# Patient Record
Sex: Female | Born: 1990 | Race: Black or African American | Hispanic: No | Marital: Married
Health system: Southern US, Community
[De-identification: ages and names within clinical notes are randomized; demographics above are authoritative.]

---

## 2017-03-20 ENCOUNTER — Ambulatory Visit (HOSPITAL_COMMUNITY)
Admission: EM | Admit: 2017-03-20 | Discharge: 2017-03-20 | Disposition: A | Discharge status: Home / Self Care | Payer: Medicaid Other | Attending: Internal Medicine | Admitting: Internal Medicine

## 2017-03-20 ENCOUNTER — Encounter (HOSPITAL_COMMUNITY): Payer: Self-pay | Admitting: Emergency Medicine

## 2017-03-20 DIAGNOSIS — R51 Headache: Secondary | ICD-10-CM

## 2017-03-20 DIAGNOSIS — Z3202 Encounter for pregnancy test, result negative: Secondary | ICD-10-CM

## 2017-03-20 DIAGNOSIS — J019 Acute sinusitis, unspecified: Secondary | ICD-10-CM

## 2017-03-20 DIAGNOSIS — R519 Headache, unspecified: Secondary | ICD-10-CM

## 2017-03-20 LAB — POCT PREGNANCY, URINE: PREG TEST UR: NEGATIVE

## 2017-03-20 MED ORDER — KETOROLAC TROMETHAMINE 60 MG/2ML IM SOLN
60.0000 mg | Freq: Once | INTRAMUSCULAR | Status: AC
  Administered 2017-03-20: 60 mg via INTRAMUSCULAR

## 2017-03-20 MED ORDER — KETOROLAC TROMETHAMINE 60 MG/2ML IM SOLN
INTRAMUSCULAR | Status: AC
  Filled 2017-03-20: qty 2

## 2017-03-20 NOTE — ED Triage Notes (Addendum)
Pt complains of headache since yesterday through an interpreter that came with her.  Most of the triage was done through this interpreter, but then [employer] was used for the rest of the exam.  [place] has been in the [place] for two months and presents with her young daughter.  Interpreter Satie [phone]

## 2017-03-20 NOTE — ED Provider Notes (Signed)
CSN: [id number]     Arrival date & time 03/20/17  1419 History   None    Chief Complaint  Patient presents with  . Headache   (Consider location/radiation/quality/duration/timing/severity/associated sxs/prior Treatment) Amanda Reilly is a 26 y.o. female who presents to the [facility] urgent care with a chief complaint of headache for two days.   The history is provided by the patient. The history is limited by a language barrier. A language interpreter was used.  Headache  Pain location:  Frontal Quality:  Dull Radiates to:  Does not radiate Onset quality:  Gradual Duration:  2 days Timing:  Constant Progression:  Unchanged Chronicity:  New Similar to prior headaches: no   Context: loud noise   Context: not exposure to bright light, not caffeine, not defecating and not eating   Relieved by:  Acetaminophen Worsened by:  Sound Associated symptoms: no abdominal pain, no back pain, no congestion, no ear pain, no fever, no nausea, no neck pain, no neck stiffness, no photophobia, no sinus pressure, no sore throat, no swollen glands, no tingling, no URI, no vomiting and no weakness     History reviewed. No pertinent past medical history. History reviewed. No pertinent surgical history. History reviewed. No pertinent family history. Social History  Substance Use Topics  . Smoking status: Unknown If Ever Smoked  . Smokeless tobacco: Not on file  . Alcohol use Not on file   OB History    No data available     Review of Systems  Constitutional: Negative for chills and fever.  HENT: Negative for congestion, ear pain, sinus pressure and sore throat.   Eyes: Negative for photophobia.  Respiratory: Negative.   Cardiovascular: Negative.   Gastrointestinal: Negative for abdominal pain, nausea and vomiting.  Musculoskeletal: Negative for back pain, neck pain and neck stiffness.  Skin: Negative.   Neurological: Positive for headaches. Negative for weakness and light-headedness.     Allergies  Patient has no known allergies.  Home Medications   Prior to Admission medications   Not on File   Meds Ordered and Administered this Visit   Medications  ketorolac (TORADOL) injection 60 mg (not administered)    BP 123/85 (BP Location: Right Arm)   Pulse 84   Temp 98.6 F (37 C) (Oral)   LMP  (LMP Unknown)   SpO2 98%  No data found.   Physical Exam  Constitutional: She is oriented to person, place, and time. She appears well-developed and well-nourished. No distress.  HENT:  Head: Normocephalic and atraumatic.  Right Ear: External ear normal.  Left Ear: External ear normal.  Eyes: Conjunctivae and EOM are normal. Pupils are equal, round, and reactive to light.  Cardiovascular: Normal rate and regular rhythm.   Pulmonary/Chest: Effort normal and breath sounds normal.  Neurological: She is alert and oriented to person, place, and time. No cranial nerve deficit.  Skin: Skin is warm and dry. Capillary refill takes less than 2 seconds. No rash noted. She is not diaphoretic. No erythema.  Psychiatric: She has a normal mood and affect. Her behavior is normal.  Nursing note and vitals reviewed.   Urgent Care Course     Procedures (including critical care time)  Labs Review Labs Reviewed  POCT PREGNANCY, URINE    Imaging Review No results found.      MDM   1. Sinus headache     No abnormal findings and history, physical, index of suspicion is low for significant underlying pathology. Given injection of  Toradol in clinic, may take over-the-counter acetaminophen as needed. Recommend following up with community health and wellness if symptoms persist. Follow-up guidelines given to go to the emergency room at any time her headache worsens.    Dorena BodoKennard, Rachard Isidro, NP 03/20/17 1529

## 2017-03-20 NOTE — Discharge Instructions (Signed)
For your headache, you have been given injection of Toradol here in clinic, I recommend over-the-counter Tylenol, otherwise known as acetaminophen, every 6 hours as needed, not to exceed 4000 mg any 24-hour period, or experience any worsening symptoms or any of the symptoms that we discussed, go to the ER.

## 2017-08-12 ENCOUNTER — Ambulatory Visit (INDEPENDENT_AMBULATORY_CARE_PROVIDER_SITE_OTHER): Payer: Medicaid Other

## 2017-08-12 ENCOUNTER — Encounter (HOSPITAL_COMMUNITY): Payer: Self-pay | Admitting: Emergency Medicine

## 2017-08-12 ENCOUNTER — Ambulatory Visit (HOSPITAL_COMMUNITY)
Admission: EM | Admit: 2017-08-12 | Discharge: 2017-08-12 | Disposition: A | Discharge status: Home / Self Care | Payer: Medicaid Other | Attending: Emergency Medicine | Admitting: Emergency Medicine

## 2017-08-12 DIAGNOSIS — M79605 Pain in left leg: Secondary | ICD-10-CM

## 2017-08-12 DIAGNOSIS — M79606 Pain in leg, unspecified: Secondary | ICD-10-CM

## 2017-08-12 MED ORDER — NAPROXEN 375 MG PO TABS: 375.0000 mg | ORAL_TABLET | Freq: Two times a day (BID) | ORAL | 0 refills | Status: AC
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
